# Patient Record
Sex: Male | Born: 1955 | Race: White | Hispanic: No | Smoking: Never smoker
Health system: Southern US, Community
[De-identification: ages and names within clinical notes are randomized; demographics above are authoritative.]

## PROBLEM LIST (undated history)

## (undated) DIAGNOSIS — K219 Gastro-esophageal reflux disease without esophagitis: Secondary | ICD-10-CM

## (undated) DIAGNOSIS — K5790 Diverticulosis of intestine, part unspecified, without perforation or abscess without bleeding: Secondary | ICD-10-CM

## (undated) DIAGNOSIS — I10 Essential (primary) hypertension: Secondary | ICD-10-CM

## (undated) DIAGNOSIS — E119 Type 2 diabetes mellitus without complications: Secondary | ICD-10-CM

## (undated) DIAGNOSIS — K635 Polyp of colon: Secondary | ICD-10-CM

## (undated) HISTORY — DX: Essential (primary) hypertension: I10

## (undated) HISTORY — DX: Polyp of colon: K63.5

## (undated) HISTORY — PX: HERNIA REPAIR: SHX51

## (undated) HISTORY — DX: Diverticulosis of intestine, part unspecified, without perforation or abscess without bleeding: K57.90

## (undated) HISTORY — DX: Gastro-esophageal reflux disease without esophagitis: K21.9

## (undated) HISTORY — PX: TONSILLECTOMY AND ADENOIDECTOMY: SUR1326

## (undated) HISTORY — PX: LASIK: SHX215

## (undated) HISTORY — DX: Type 2 diabetes mellitus without complications: E11.9

---

## 2007-08-13 ENCOUNTER — Ambulatory Visit: Payer: Self-pay | Admitting: Gastroenterology

## 2007-08-27 ENCOUNTER — Ambulatory Visit: Payer: Self-pay | Admitting: Gastroenterology

## 2007-08-27 ENCOUNTER — Encounter: Payer: Self-pay | Admitting: Gastroenterology

## 2007-08-27 HISTORY — PX: COLONOSCOPY: SHX174

## 2012-12-29 ENCOUNTER — Telehealth: Payer: Self-pay | Admitting: Gastroenterology

## 2012-12-29 NOTE — Telephone Encounter (Signed)
I have left a message for the patient that I will have Dr. Russella Dar review his past colonoscopy and path and advise when he returns to the office next week.

## 2013-01-03 NOTE — Telephone Encounter (Signed)
NO adenomatous polyps at last colonoscopy and had an excellent prep. If he has no symptoms and no new risk factors a 10 year interval is appropriate, 07/2017.

## 2013-01-03 NOTE — Telephone Encounter (Signed)
Patient reports no changes in risk factors or symptoms.  He is advised he is due 07/2017.  He will call back in the meantime for any new GI health concerns.  Recall added for 07/2017

## 2014-03-13 ENCOUNTER — Encounter: Payer: Self-pay | Admitting: Gastroenterology

## 2014-04-26 ENCOUNTER — Encounter: Payer: Self-pay | Admitting: Neurology

## 2014-05-04 ENCOUNTER — Ambulatory Visit (INDEPENDENT_AMBULATORY_CARE_PROVIDER_SITE_OTHER): Payer: 59 | Admitting: Neurology

## 2014-05-04 ENCOUNTER — Encounter: Payer: Self-pay | Admitting: Neurology

## 2014-05-04 VITALS — BP 139/101 | HR 107 | Temp 99.0°F | Ht 72.0 in | Wt 300.0 lb

## 2014-05-04 DIAGNOSIS — R51 Headache: Secondary | ICD-10-CM

## 2014-05-04 DIAGNOSIS — R351 Nocturia: Secondary | ICD-10-CM

## 2014-05-04 DIAGNOSIS — R519 Headache, unspecified: Secondary | ICD-10-CM

## 2014-05-04 DIAGNOSIS — G4733 Obstructive sleep apnea (adult) (pediatric): Secondary | ICD-10-CM

## 2014-05-04 DIAGNOSIS — G4719 Other hypersomnia: Secondary | ICD-10-CM

## 2014-05-04 NOTE — Progress Notes (Signed)
Subjective:    [name] is a 58 y.o. male.  HPI     Thomas [name] [facility] [address] [place], [place] [address]  Dear Dr. Brigitte Pulse,   I saw your [name], Thomas [name], upon your kind request in my neurologic clinic today for initial consultation of his sleep disorder, in particular reevaluation of his prior diagnosis of obstructive sleep apnea. Thomas [name] is unaccompanied today. As you know, Thomas [name] is a 58 year old right-handed gentleman with an underlying medical history of obesity, hypogonadism, hypertension, impaired fasting glucose, who was diagnosed with mild obstructive sleep apnea almost 10 years ago. I do not have his prior sleep study results available for review. However, he has had a significant amount of weight gain since then in Thomas realm of 45 pounds and now has louder snoring and witnessed breathing pauses while asleep. He presents for reevaluation and possible treatment for OSA.  He works as a Land. He does not smoke and drinks alcohol very occasionally. He has mild occasional morning HAs. He has a FHx of OSA in one sister and his brother, who has a CPAP.   His typical bedtime is reported to be around 9:30 to 10 PM and usual wake time is around 6 to 6:30 AM. Sleep onset typically occurs within minutes. He reports feeling marginally rested upon awakening. He wakes up on an average 2 to 6 times in Thomas middle of Thomas night and has to go to Thomas bathroom 2 to 6 times on a typical night. He reports excessive daytime somnolence (EDS) and His Epworth Sleepiness Score (ESS) is 15/24 today. He has not fallen asleep while driving . Thomas [name] has not been taking a planned nap. His snoring is loud and associated with witnessed apneas. While he denies frank choking sensations or waking up with a gasp, he does endorse waking up multiple times in Thomas night.  He watches TV in bed.he puts it on a timer at night. He  drinks Pepsi 2-6 times per day. This can be as late as nighttime. He does not drink much in Thomas way of coffee.  His Past Medical History Is Significant For: Past Medical History  Diagnosis Date  . Hypertension     His Past Surgical History Is Significant For: Past Surgical History  Procedure Laterality Date  . Hernia repair  1981,2000    x3  . Tonsillectomy and adenoidectomy    . Lasik      His Family History Is Significant For: Family History  Problem Relation Age of Onset  . Heart Problems    . Heart Problems Mother   . Heart Problems Father     His Social History Is Significant For: History   Social History  . Marital Status: Divorced    Spouse Name: N/A    Number of Children: 2  . Years of Education: BSME   Occupational History  .      [employer]   Social History Main Topics  . Smoking status: Never Smoker   . Smokeless tobacco: Never Used  . Alcohol Use: 0.0 oz/week    0 Not specified per week     Comment: 2 times monthly  . Drug Use: No  . Sexual Activity: Not on file   Other Topics Concern  . Not on file   Social History Narrative   [name] consumes no caffeine     His Allergies Are:  Allergies  Allergen  Reactions  . Bee Pollen     Severe swelling  :   His Current Medications Are:  Outpatient Encounter Prescriptions as of 05/04/2014  Medication Sig  . amLODipine-valsartan (EXFORGE) 5-160 MG per tablet daily.  . metoprolol succinate (TOPROL-XL) 25 MG 24 hr tablet daily.  :  Review of Systems:  Out of a complete 14 point review of systems, all are reviewed and negative with Thomas exception of these symptoms as listed below:   Review of Systems  Neurological:       Sleepiness, snoring    Objective:  Neurologic Exam  Physical Exam Physical Examination:   Filed Vitals:   05/04/14 0836  BP: 139/101  Pulse: 107  Temp: 99 F (37.2 C)    General Examination: Thomas [name] is a very pleasant 58 y.o. male in no acute distress. He appears  well-developed and well-nourished and well groomed. He is obese.   HEENT: Normocephalic, atraumatic, pupils are equal, round and reactive to light and accommodation. Funduscopic exam is normal with sharp disc margins noted. Extraocular tracking is good without limitation to gaze excursion or nystagmus noted. Normal smooth pursuit is noted. Hearing is grossly intact. Tympanic membranes are clear bilaterally. Face is symmetric with normal facial animation and normal facial sensation. Speech is clear with no dysarthria noted. There is no hypophonia. There is no lip, neck/head, jaw or voice tremor. Neck is supple with full range of passive and active motion. There are no carotid bruits on auscultation. Oropharynx exam reveals: moderate mouth dryness, good dental hygiene and moderate airway crowding, due to narrow airway entry, redundant soft palate. Mallampati is class III. Tongue protrudes centrally and palate elevates symmetrically. Tonsils are absent. Neck size is 19 1/8 inches. He has a Absent overbite. Nasal inspection reveals no significant nasal mucosal bogginess or redness and no septal deviation.   Chest: Clear to auscultation without wheezing, rhonchi or crackles noted.  Heart: S1+S2+0, regular and normal without murmurs, rubs or gallops noted.   Abdomen: Soft, non-tender and non-distended with normal bowel sounds appreciated on auscultation.  Extremities: There is no pitting edema in Thomas distal lower extremities bilaterally. Pedal pulses are intact.  Skin: Warm and dry without trophic changes noted. There are no varicose veins.  Musculoskeletal: exam reveals no obvious joint deformities, tenderness or joint swelling or erythema.   Neurologically:  Mental status: Thomas [name] is awake, alert and oriented in all 4 spheres. His immediate and remote memory, attention, language skills and fund of knowledge are appropriate. There is no evidence of aphasia, agnosia, apraxia or anomia. Speech is clear  with normal prosody and enunciation. Thought process is linear. Mood is normal and affect is normal.  Cranial nerves II - XII are as described above under HEENT exam. In addition: shoulder shrug is normal with equal shoulder height noted. Motor exam: Normal bulk, strength and tone is noted. There is no drift, tremor or rebound. Romberg is negative. Reflexes are 2+ throughout. Babinski: Toes are flexor bilaterally. Fine motor skills and coordination: intact with normal finger taps, normal hand movements, normal rapid alternating patting, normal foot taps and normal foot agility.  Cerebellar testing: No dysmetria or intention tremor on finger to nose testing. Heel to shin is unremarkable bilaterally. There is no truncal or gait ataxia.  Sensory exam: intact to light touch, pinprick, vibration, temperature sense in Thomas upper and lower extremities.  Gait, station and balance: He stands easily. No veering to one side is noted. No leaning to one side is noted.  Posture is age-appropriate and stance is narrow based. Gait shows normal stride length and normal pace. No problems turning are noted. He turns en bloc. Tandem walk is unremarkable. Intact toe and heel stance is noted.               Assessment and Plan:   In summary, Thomas [name] is a very pleasant 58 y.o.-year old male with an underlying medical history of obesity, hypogonadism, hypertension, impaired fasting glucose, Who presents for reevaluation of his prior diagnosis of obstructive sleep apnea from 9 or 10 years ago. He endorses significant weight gain since his original sleep study. He reports nocturia, occasional and mild her morning headaches, excessive daytime somnolence, snoring and witnessed apneas. I had a long chat with Thomas [name] about my findings and Thomas diagnosis of OSA, its prognosis and treatment options. We talked about medical treatments, surgical interventions and non-pharmacological approaches. I explained in particular Thomas risks and  ramifications of untreated moderate to severe OSA, especially with respect to developing cardiovascular disease down Thomas Road, including congestive heart failure, difficult to treat hypertension, cardiac arrhythmias, or stroke. Even type 2 diabetes has, in part, been linked to untreated OSA. Symptoms of untreated OSA include daytime sleepiness, memory problems, mood irritability and mood disorder such as depression and anxiety, lack of energy, as well as recurrent headaches, especially morning headaches. We talked about trying to maintain a healthy lifestyle in general, as well as Thomas importance of weight control. I encouraged Thomas [name] to eat healthy, exercise daily and keep well hydrated, to keep a scheduled bedtime and wake time routine, to not skip any meals and eat healthy snacks in between meals. I advised Thomas [name] not to drive when feeling sleepy. I recommended Thomas following at this time: sleep study with potential positive airway pressure titration. (We will score hypopneas at 4% and split Thomas sleep study into diagnostic and treatment portion, if Thomas estimated. 2 hour AHI is >20/h).   I explained Thomas sleep test procedure to Thomas [name] and also outlined possible surgical and non-surgical treatment options of OSA, including Thomas use of a custom-made dental device (which would require a referral to a specialist dentist or oral surgeon), upper airway surgical options, such as pillar implants, radiofrequency surgery, tongue base surgery, and UPPP (which would involve a referral to an ENT surgeon). Rarely, jaw surgery such as mandibular advancement may be considered.  I also explained Thomas CPAP treatment option to Thomas [name], who indicated that he would be very reluctant to consider CPAP treatment but willing to try it if Thomas need arises. I explained Thomas importance of being compliant with PAP treatment, not only for insurance purposes but primarily to improve His symptoms, and for Thomas [name]'s long term  health benefit, including to reduce His cardiovascular risks. I answered all his questions today and Thomas [name] was in agreement. I would like to see him back after Thomas sleep study is completed and encouraged him to call with any interim questions, concerns, problems or updates.   Thank you very much for allowing me to participate in Thomas care of this nice [name]. If I can be of any further assistance to you please do not hesitate to call me at [phone].  Sincerely,   Thomas [name]

## 2014-05-04 NOTE — Patient Instructions (Signed)

## 2017-08-24 ENCOUNTER — Encounter: Payer: Self-pay | Admitting: Gastroenterology

## 2018-03-03 ENCOUNTER — Encounter: Payer: Self-pay | Admitting: Gastroenterology

## 2018-03-22 ENCOUNTER — Encounter: Payer: Self-pay | Admitting: *Deleted

## 2018-03-22 ENCOUNTER — Ambulatory Visit (AMBULATORY_SURGERY_CENTER): Payer: Self-pay | Admitting: *Deleted

## 2018-03-22 VITALS — Ht 72.0 in | Wt 305.4 lb

## 2018-03-22 DIAGNOSIS — Z1211 Encounter for screening for malignant neoplasm of colon: Secondary | ICD-10-CM

## 2018-03-22 MED ORDER — NA SULFATE-K SULFATE-MG SULF 17.5-3.13-1.6 GM/177ML PO SOLN: 1.0000 | Freq: Once | ORAL | 0 refills | Status: AC

## 2018-03-22 NOTE — Progress Notes (Addendum)
No egg or soy allergy known to patient  No issues with past sedation with any surgeries  or procedures, no intubation problems  No diet pills per patient No home 02 use per patient  No blood thinners per patient  Pt denies issues with constipation  No A fib or A flutter  EMMI video sent to pt's e mail  Pt called and informed to hold Metformin the day of the colon- new instruction with this information printed and mailed to pt

## 2018-03-29 ENCOUNTER — Encounter: Payer: Self-pay | Admitting: Gastroenterology

## 2018-04-12 ENCOUNTER — Ambulatory Visit (AMBULATORY_SURGERY_CENTER): Payer: 59 | Admitting: Gastroenterology

## 2018-04-12 ENCOUNTER — Other Ambulatory Visit: Payer: Self-pay

## 2018-04-12 ENCOUNTER — Encounter: Payer: Self-pay | Admitting: Gastroenterology

## 2018-04-12 VITALS — BP 141/107 | HR 92 | Temp 99.6°F | Resp 18 | Ht 72.0 in | Wt 300.0 lb

## 2018-04-12 DIAGNOSIS — D122 Benign neoplasm of ascending colon: Secondary | ICD-10-CM

## 2018-04-12 DIAGNOSIS — Z1211 Encounter for screening for malignant neoplasm of colon: Secondary | ICD-10-CM | POA: Diagnosis not present

## 2018-04-12 DIAGNOSIS — D120 Benign neoplasm of cecum: Secondary | ICD-10-CM

## 2018-04-12 MED ORDER — SODIUM CHLORIDE 0.9 % IV SOLN: 500.0000 mL | Freq: Once | INTRAVENOUS | Status: DC

## 2018-04-12 NOTE — Patient Instructions (Signed)
Please read handouts provided. Continue present medications. High Fiber Diet. Await pathology results.     YOU HAD AN ENDOSCOPIC PROCEDURE TODAY AT [facility]:   Refer to the procedure report that was given to you for any specific questions about what was found during the examination.  If the procedure report does not answer your questions, please call your gastroenterologist to clarify.  If you requested that your care partner not be given the details of your procedure findings, then the procedure report has been included in a sealed envelope for you to review at your convenience later.  YOU SHOULD EXPECT: Some feelings of bloating in the abdomen. Passage of more gas than usual.  Walking can help get rid of the air that was put into your GI tract during the procedure and reduce the bloating. If you had a lower endoscopy (such as a colonoscopy or flexible sigmoidoscopy) you may notice spotting of blood in your stool or on the toilet paper. If you underwent a bowel prep for your procedure, you may not have a normal bowel movement for a few days.  Please Note:  You might notice some irritation and congestion in your nose or some drainage.  This is from the oxygen used during your procedure.  There is no need for concern and it should clear up in a day or so.  SYMPTOMS TO REPORT IMMEDIATELY:   Following lower endoscopy (colonoscopy or flexible sigmoidoscopy):  Excessive amounts of blood in the stool  Significant tenderness or worsening of abdominal pains  Swelling of the abdomen that is new, acute  Fever of 100F or higher    For urgent or emergent issues, a gastroenterologist can be reached at any hour by calling [phone].   DIET:  We do recommend a small meal at first, but then you may proceed to your regular diet.  Drink plenty of fluids but you should avoid alcoholic beverages for 24 hours.  ACTIVITY:  You should plan to take it easy for the rest of today and  you should NOT DRIVE or use heavy machinery until tomorrow (because of the sedation medicines used during the test).    FOLLOW UP: Our staff will call the number listed on your records the next business day following your procedure to check on you and address any questions or concerns that you may have regarding the information given to you following your procedure. If we do not reach you, we will leave a message.  However, if you are feeling well and you are not experiencing any problems, there is no need to return our call.  We will assume that you have returned to your regular daily activities without incident.  If any biopsies were taken you will be contacted by phone or by letter within the next 1-3 weeks.  Please call us at [phone] if you have not heard about the biopsies in 3 weeks.    SIGNATURES/CONFIDENTIALITY: You and/or your care partner have signed paperwork which will be entered into your electronic medical record.  These signatures attest to the fact that that the information above on your After Visit Summary has been reviewed and is understood.  Full responsibility of the confidentiality of this discharge information lies with you and/or your care-partner.

## 2018-04-12 NOTE — Op Note (Signed)
[facility] Patient Name: Thomas [name] Procedure Date: 04/12/2018 3:05 PM MRN: [id number] Endoscopist: Ladene Artist , MD Age: 62 Referring MD:  Date of Birth: 1956/04/12 Gender: Male Account #: [id number] Procedure:                Colonoscopy Indications:              Screening for colorectal malignant neoplasm Medicines:                Monitored Anesthesia Care Procedure:                Pre-Anesthesia Assessment:                           - Prior to the procedure, a History and Physical                            was performed, and patient medications and                            allergies were reviewed. The patient's tolerance of                            previous anesthesia was also reviewed. The risks                            and benefits of the procedure and the sedation                            options and risks were discussed with the patient.                            All questions were answered, and informed consent                            was obtained. Prior Anticoagulants: The patient has                            taken no previous anticoagulant or antiplatelet                            agents. ASA Grade Assessment: II - A patient with                            mild systemic disease. After reviewing the risks                            and benefits, the patient was deemed in                            satisfactory condition to undergo the procedure.                           After obtaining informed consent, the colonoscope  was passed under direct vision. Throughout the                            procedure, the patient's blood pressure, pulse, and                            oxygen saturations were monitored continuously. The                            Colonoscope was introduced through the anus and                            advanced to the the cecum, identified by                            appendiceal orifice and  ileocecal valve. The                            ileocecal valve, appendiceal orifice, and rectum                            were photographed. The quality of the bowel                            preparation was good. The colonoscopy was performed                            without difficulty. The patient tolerated the                            procedure well. Scope In: 3:10:59 PM Scope Out: 3:22:39 PM Scope Withdrawal Time: 0 hours 9 minutes 25 seconds  Total Procedure Duration: 0 hours 11 minutes 40 seconds  Findings:                 The perianal and digital rectal examinations were                            normal.                           Two sessile polyps were found in the ascending                            colon and cecum. The polyps were 4 to 5 mm in size.                            These polyps were removed with a cold biopsy                            forceps. Resection and retrieval were complete.                           Multiple medium-mouthed diverticula were found in  the left colon. There was no evidence of                            diverticular bleeding.                           Internal hemorrhoids were found during                            retroflexion. The hemorrhoids were small and Grade                            I (internal hemorrhoids that do not prolapse).                           The exam was otherwise without abnormality on                            direct and retroflexion views. Complications:            No immediate complications. Estimated blood loss:                            None. Estimated Blood Loss:     Estimated blood loss: none. Impression:               - Two 4 to 5 mm polyps in the ascending colon and                            in the cecum, removed with a cold biopsy forceps.                            Resected and retrieved.                           - Moderate diverticulosis in the left colon. There                             was no evidence of diverticular bleeding.                           - Internal hemorrhoids.                           - The examination was otherwise normal on direct                            and retroflexion views. Recommendation:           - Repeat colonoscopy in 5 years for surveillance if                            polyp(s) are adenomatous, otherwise 10 years.                           - Patient has a contact number available for  emergencies. The signs and symptoms of potential                            delayed complications were discussed with the                            patient. Return to normal activities tomorrow.                            Written discharge instructions were provided to the                            patient.                           - High fiber diet.                           - Continue present medications.                           - Await pathology results. Ladene Artist, MD 04/12/2018 3:26:05 PM This report has been signed electronically.

## 2018-04-12 NOTE — Progress Notes (Signed)
Pt's states no medical or surgical changes since previsit or office visit. 

## 2018-04-12 NOTE — Progress Notes (Signed)
A and O x3. Report to RN. Tolerated MAC anesthesia well.

## 2018-04-12 NOTE — Progress Notes (Signed)
Called to room to assist during endoscopic procedure.  Patient ID and intended procedure confirmed with present staff. Received instructions for my participation in the procedure from the performing physician.  

## 2018-04-13 ENCOUNTER — Telehealth: Payer: Self-pay

## 2018-04-13 NOTE — Telephone Encounter (Signed)
  Follow up Call-  Call back number 04/12/2018  Post procedure Call Back phone  # [phone]  Permission to leave phone message Yes  Some recent data might be hidden     Patient questions:  Do you have a fever, pain , or abdominal swelling? No. Pain Score  0 *  Have you tolerated food without any problems? Yes.    Have you been able to return to your normal activities? Yes.    Do you have any questions about your discharge instructions: Diet   No. Medications  No. Follow up visit  No.  Do you have questions or concerns about your Care? No.  Actions: * If pain score is 4 or above: No action needed, pain <4.

## 2018-04-26 ENCOUNTER — Encounter: Payer: Self-pay | Admitting: Gastroenterology

## 2018-07-20 ENCOUNTER — Other Ambulatory Visit: Payer: Self-pay | Admitting: Internal Medicine

## 2018-07-20 DIAGNOSIS — E785 Hyperlipidemia, unspecified: Secondary | ICD-10-CM

## 2018-11-29 ENCOUNTER — Ambulatory Visit
Admission: RE | Admit: 2018-11-29 | Discharge: 2018-11-29 | Disposition: A | Discharge status: Home / Self Care | Payer: 59 | Source: Ambulatory Visit | Attending: Internal Medicine | Admitting: Internal Medicine

## 2018-11-29 ENCOUNTER — Other Ambulatory Visit: Payer: Self-pay | Admitting: Internal Medicine

## 2018-11-29 ENCOUNTER — Other Ambulatory Visit: Payer: Self-pay

## 2018-11-29 DIAGNOSIS — R109 Unspecified abdominal pain: Secondary | ICD-10-CM

## 2019-11-29 DEATH — deceased

## 2020-10-13 IMAGING — CT CT ABDOMEN AND PELVIS WITHOUT CONTRAST
1 of 3 series · 14 of 32 positions shown, 19 images · non-contrast
Comparison: None.

CLINICAL DATA: Acute right flank pain.  Microhematuria.

EXAM:
CT ABDOMEN AND PELVIS WITHOUT CONTRAST
TECHNIQUE: Multidetector CT imaging of the abdomen and pelvis was performed
following the standard protocol without IV contrast.

[Series 2: abd/pelvis w/(date) · axial · 0.96mm/px · z∈[-512,-77]mm · 14 of 99 slices shown, 19 images]
[im 6/99  soft-tissue]
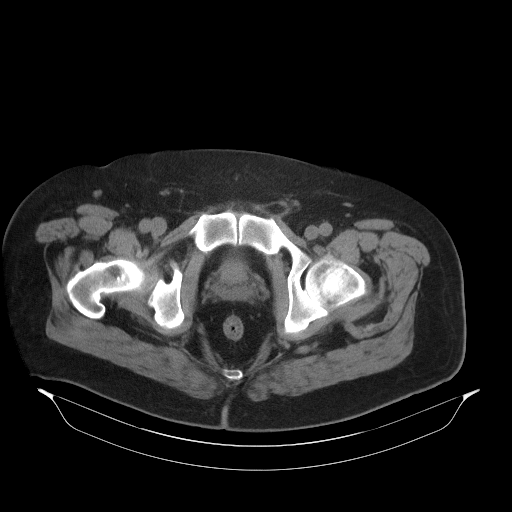
[im 6/99  bone]
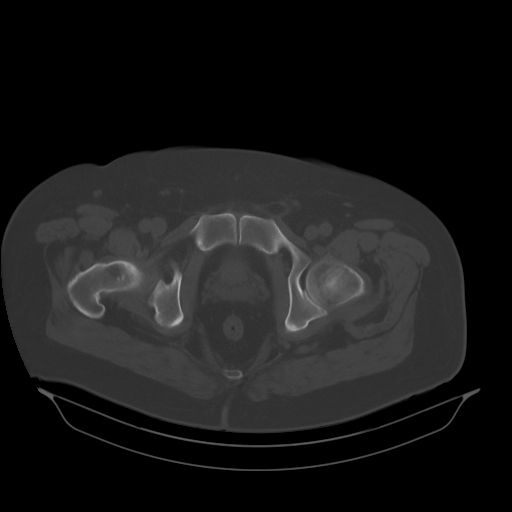
[im 12/99  soft-tissue]
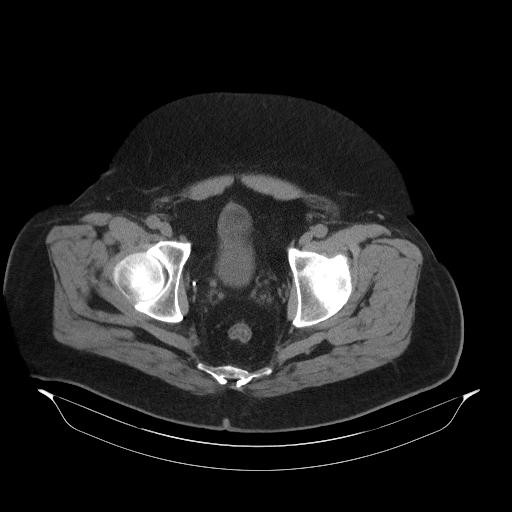
[im 24/99  soft-tissue]
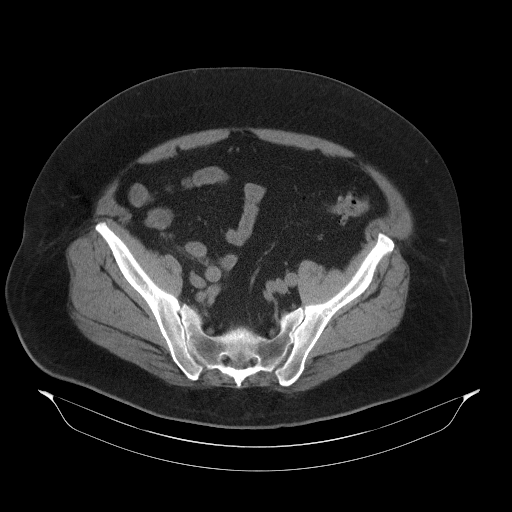
[im 29/99  soft-tissue]
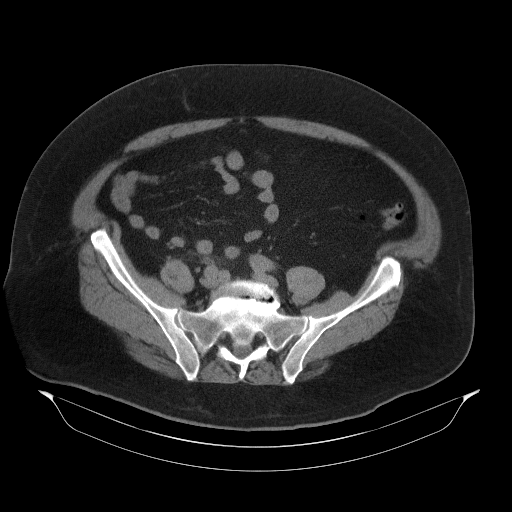
[im 35/99  soft-tissue]
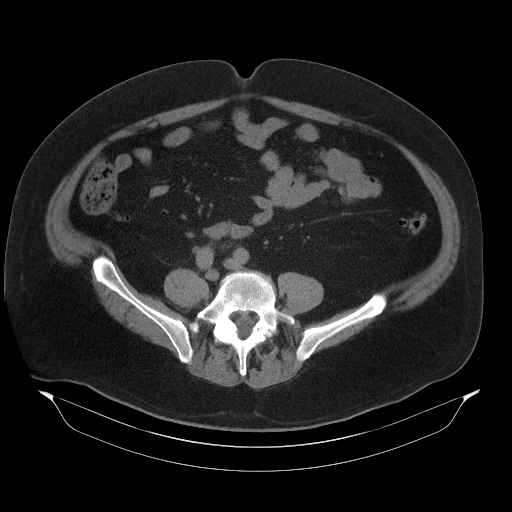
[im 41/99  soft-tissue]
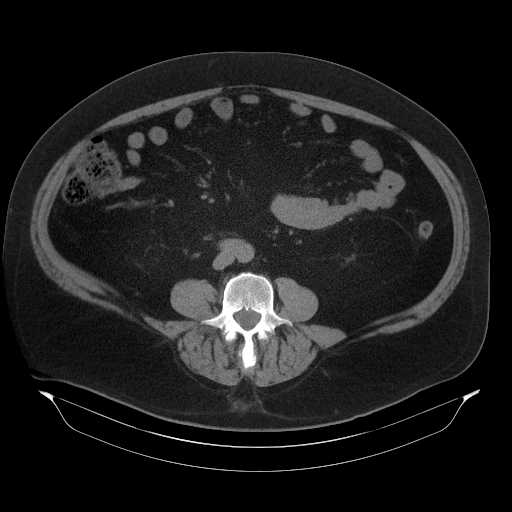
[im 52/99  soft-tissue]
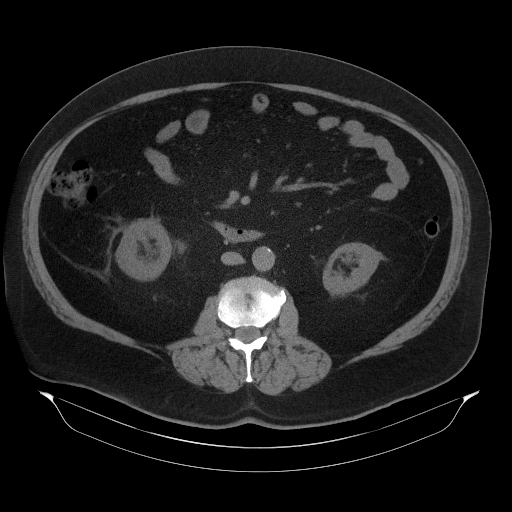
[im 58/99  soft-tissue]
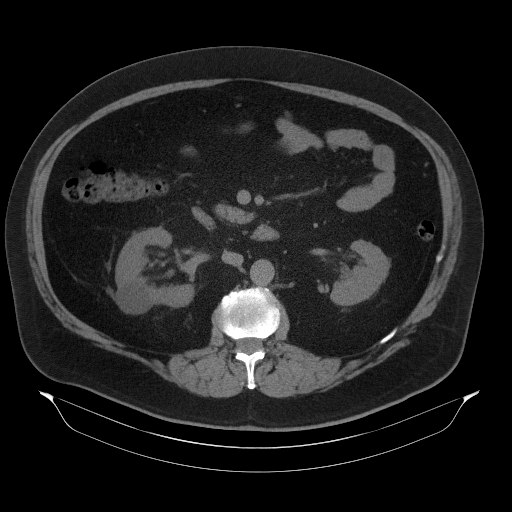
[im 64/99  soft-tissue]
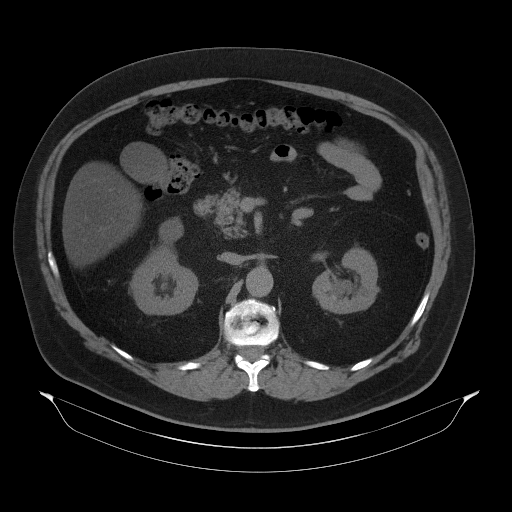
[im 64/99  bone]
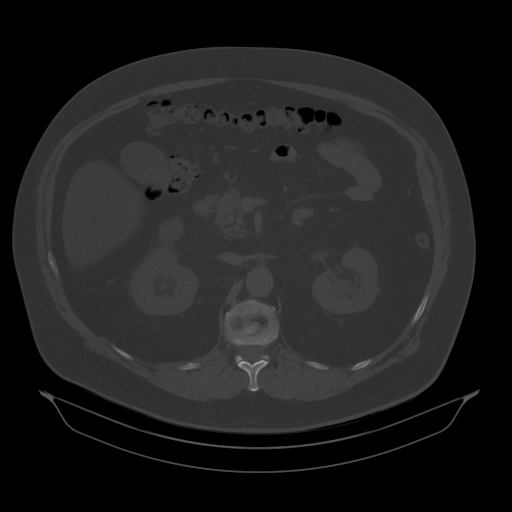
[im 70/99  soft-tissue]
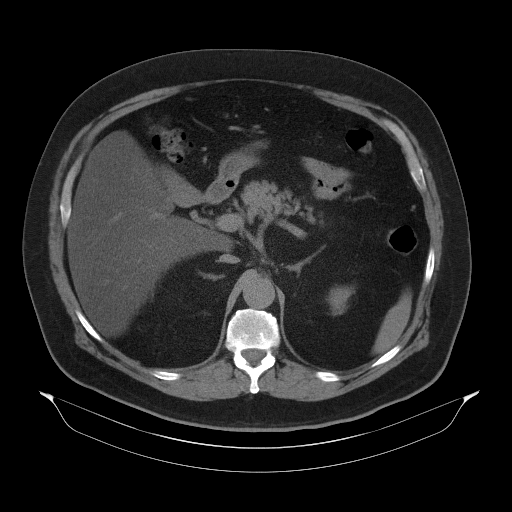
[im 75/99  soft-tissue]
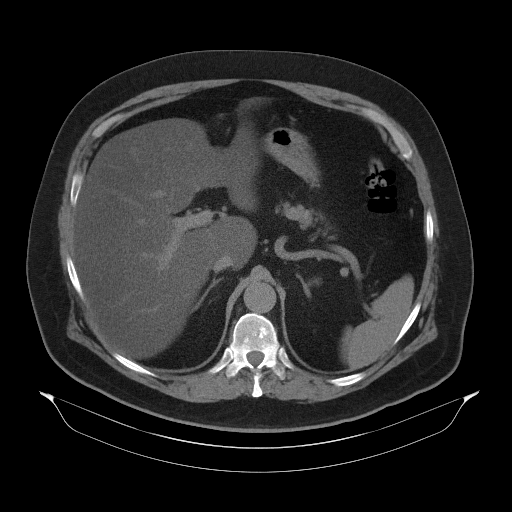
[im 75/99  lung]
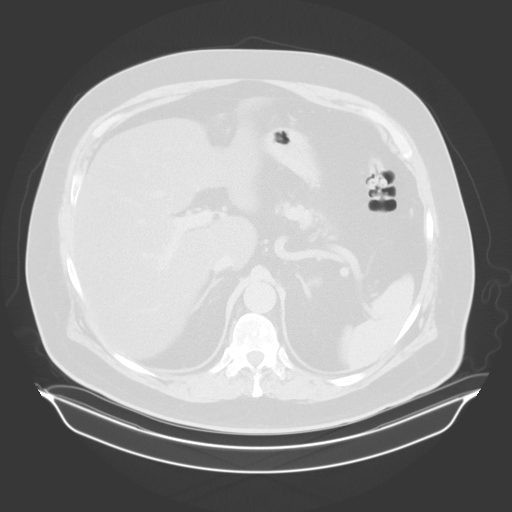
[im 81/99  lung]
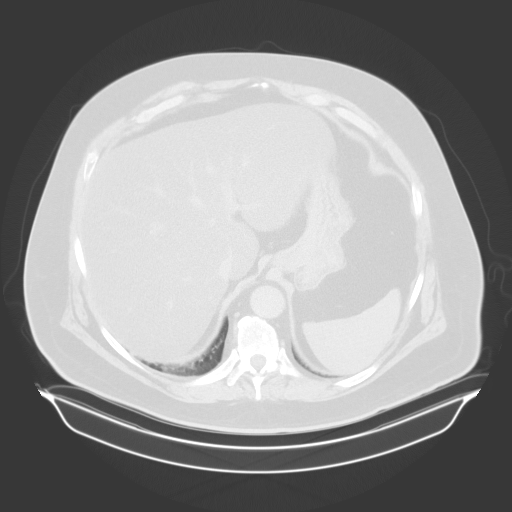
[im 87/99  soft-tissue]
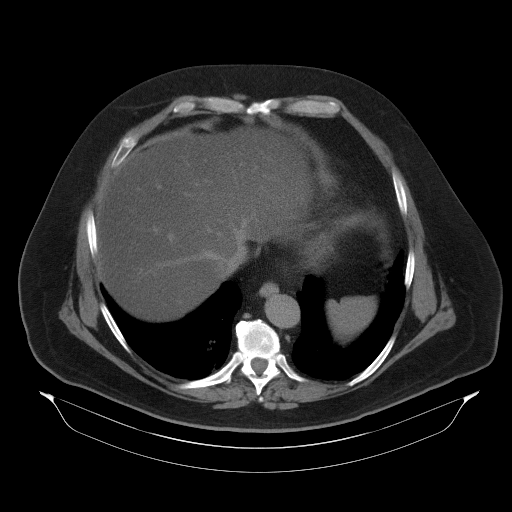
[im 87/99  lung]
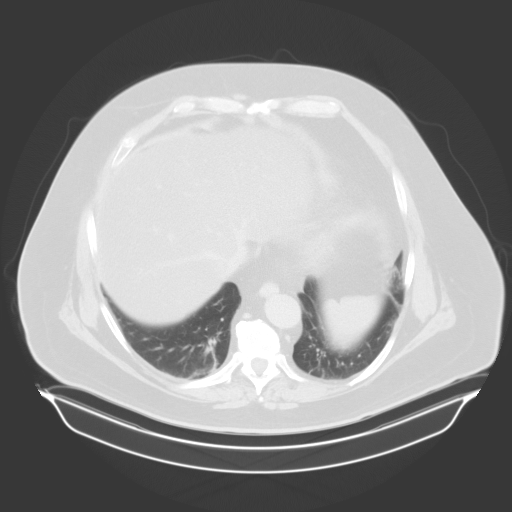
[im 93/99  soft-tissue]
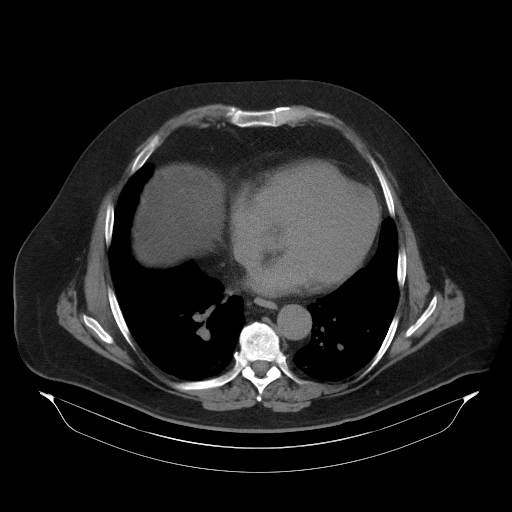
[im 93/99  lung]
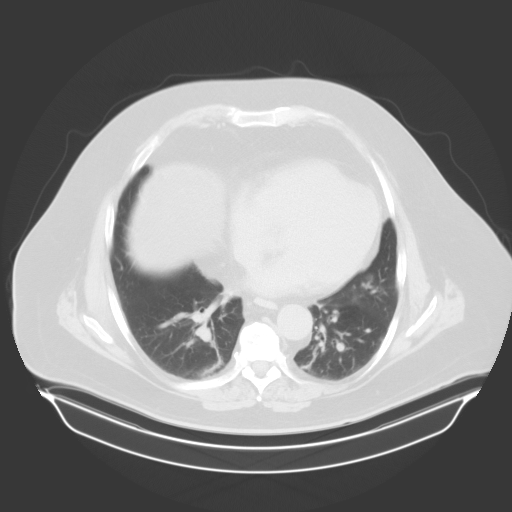

[14 of 32 positions shown; findings below may reference images not displayed]

FINDINGS: Lower chest: No acute abnormality.

Hepatobiliary: No gallstones or biliary dilatation is noted. Hepatic
steatosis is noted.

Pancreas: Unremarkable. No pancreatic ductal dilatation or
surrounding inflammatory changes.

Spleen: Normal in size without focal abnormality.

Adrenals/Urinary Tract: Adrenal glands appear normal. Left kidney
and ureter are unremarkable. Right renal cysts are noted. Mild right
hydroureteronephrosis is noted with perinephric and periureteral
stranding, but no obstructing calculus is noted. Urinary bladder is
unremarkable.

Stomach/Bowel: Stomach is within normal limits. Appendix appears
normal. No evidence of bowel wall thickening, distention, or
inflammatory changes. Diverticulosis of descending and sigmoid colon
is noted without inflammation.

Vascular/Lymphatic: No significant vascular findings are present. No
enlarged abdominal or pelvic lymph nodes.

Reproductive: Prostate is unremarkable.

Other: No abdominal wall hernia or abnormality. No abdominopelvic
ascites.

Musculoskeletal: No acute or significant osseous findings.
IMPRESSION: Mild right hydroureteronephrosis is noted with perinephric and
periureteral stranding, but no obstructing calculus is noted.
Potentially this may represent distal ureteral spasm from recently
passed stone.

Hepatic steatosis.

## 2023-05-12 ENCOUNTER — Encounter: Payer: Self-pay | Admitting: Gastroenterology
